# Patient Record
Sex: Male | Born: 1995 | Race: Black or African American | Hispanic: No | Marital: Single | Smoking: Never smoker
Health system: Southern US, Community
[De-identification: ages and names within clinical notes are randomized; demographics above are authoritative.]

## PROBLEM LIST (undated history)

## (undated) DIAGNOSIS — T7840XA Allergy, unspecified, initial encounter: Secondary | ICD-10-CM

## (undated) DIAGNOSIS — J45909 Unspecified asthma, uncomplicated: Secondary | ICD-10-CM

## (undated) HISTORY — DX: Allergy, unspecified, initial encounter: T78.40XA

---
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
## 2016-10-05 ENCOUNTER — Emergency Department (HOSPITAL_COMMUNITY)
Admission: EM | Admit: 2016-10-05 | Discharge: 2016-10-05 | Disposition: A | Discharge status: Home / Self Care | Payer: Self-pay | Attending: Emergency Medicine | Admitting: Emergency Medicine

## 2016-10-05 ENCOUNTER — Encounter (HOSPITAL_COMMUNITY): Payer: Self-pay | Admitting: Emergency Medicine

## 2016-10-05 DIAGNOSIS — J45909 Unspecified asthma, uncomplicated: Secondary | ICD-10-CM | POA: Insufficient documentation

## 2016-10-05 DIAGNOSIS — L299 Pruritus, unspecified: Secondary | ICD-10-CM | POA: Insufficient documentation

## 2016-10-05 HISTORY — DX: Unspecified asthma, uncomplicated: J45.909

## 2016-10-05 MED ORDER — FAMOTIDINE 20 MG PO TABS: 20.0000 mg | ORAL_TABLET | Freq: Two times a day (BID) | ORAL | 0 refills | Status: DC

## 2016-10-05 NOTE — ED Provider Notes (Signed)
[facility]-EMERGENCY DEPT Provider Note    By signing my name below, I, Earmon PhoenixJennifer Waddell, attest that this documentation has been prepared under the direction and in the presence of Melburn HakeNicole Nadeau, PA-C. Electronically Signed: Earmon PhoenixJennifer Waddell, ED Scribe. 10/05/16. 2:26 PM.   History   Chief Complaint Chief Complaint  Patient presents with  . Rash  . Hot Flashes   The history is provided by the patient and medical records. No language interpreter was used.    HPI Comments:  Gregory Mendez is a 20 y.o. male who presents to the Emergency Department complaining of an allergic reaction to something two weeks ago. Pt states he got hives on back and extremities and took Benadryl for treatment. He reports resolution of the hives but states once he gets hot and is active he feels a burning pain and itching where the hives were but denies any new rash/hives. He has not done anything to treat the symptoms. He denies any new medications, soaps, creams, lotions, detergents or any other personal hygiene products. No modifying factors are indicated. He denies HA, light headedness, dizziness, fever, chills, nausea, vomiting, abdominal pain, diarrhea, CP, SOB, difficulty swallowing, diaphoresis, numbness, tingling or weakness of any extremity or current rash. He reports allergy to Zyrtec. He does not have a PCP.   Past Medical History:  Diagnosis Date  . Asthma     There are no active problems to display for this patient.   History reviewed. No pertinent surgical history.     Home Medications    Prior to Admission medications   Medication Sig Start Date End Date Taking? Authorizing Provider  famotidine (PEPCID) 20 MG tablet Take 1 tablet (20 mg total) by mouth 2 (two) times daily. 10/05/16   Barrett HenleNicole Elizabeth Nadeau, PA-C    Family History History reviewed. No pertinent family history.  Social History Social History  Substance Use Topics  . Smoking status: Never Smoker  . Smokeless tobacco:  Never Used  . Alcohol use No     Allergies   Zyrtec [cetirizine]   Review of Systems Review of Systems  Constitutional: Negative for chills, diaphoresis and fever.  HENT: Negative for sore throat and trouble swallowing.   Eyes: Negative for visual disturbance.  Respiratory: Negative for shortness of breath.   Cardiovascular: Negative for chest pain.  Gastrointestinal: Negative for abdominal pain, diarrhea, nausea and vomiting.  Musculoskeletal: Negative for back pain and myalgias.  Skin: Positive for rash.  Allergic/Immunologic: Negative for immunocompromised state.  Neurological: Negative for dizziness, light-headedness and headaches.  Hematological: Does not bruise/bleed easily.     Physical Exam Updated Vital Signs BP 136/91 (BP Location: Right Arm)   Pulse 80   Temp 98.3 F (36.8 C) (Oral)   Resp 18   Ht 5\' 7"  (1.702 m)   Wt 135 lb (61.2 kg)   SpO2 100%   BMI 21.14 kg/m   Physical Exam  Constitutional: He is oriented to person, place, and time. He appears well-developed and well-nourished.  HENT:  Head: Normocephalic and atraumatic.  Mouth/Throat: Uvula is midline, oropharynx is clear and moist and mucous membranes are normal. No oropharyngeal exudate.  Eyes: Conjunctivae and EOM are normal. Right eye exhibits no discharge. Left eye exhibits no discharge. No scleral icterus.  Neck: Normal range of motion. Neck supple.  Cardiovascular: Normal rate, regular rhythm, normal heart sounds and intact distal pulses.   Pulmonary/Chest: Effort normal and breath sounds normal. No respiratory distress. He has no wheezes. He has no rales. He  exhibits no tenderness.  Abdominal: Soft. Bowel sounds are normal. He exhibits no distension and no mass. There is no tenderness. There is no rebound and no guarding.  Musculoskeletal: Normal range of motion. He exhibits no edema, tenderness or deformity.  Lymphadenopathy:    He has no cervical adenopathy.  Neurological: He is alert and  oriented to person, place, and time. No cranial nerve deficit or sensory deficit. He exhibits normal muscle tone. Coordination normal.  Normal strength and sensation.  Skin: Skin is warm and dry. Capillary refill takes less than 2 seconds. No rash noted.  Nursing note and vitals reviewed.    ED Treatments / Results  DIAGNOSTIC STUDIES: Oxygen Saturation is 100% on RA, normal by my interpretation.   COORDINATION OF CARE: 2:23 PM- Will prescribe Hydroxyzine and [facility]. Advise pt to take Tylenol/Ibuprofen for pain. Pt verbalizes understanding and agrees to plan.  Medications - No data to display  Labs (all labs ordered are listed, but only abnormal results are displayed) Labs Reviewed - No data to display  EKG  EKG Interpretation None       Radiology No results found.  Procedures Procedures (including critical care time)  Medications Ordered in ED Medications - No data to display   Initial Impression / Assessment and Plan / ED Course  I have reviewed the triage vital signs and the nursing notes.  Pertinent labs & imaging results that were available during my care of the patient were reviewed by me and considered in my medical decision making (see chart for details).  Clinical Course     Pt presents with itching/burning sensation to his arms, legs and back that occurs whenever he is active. Denies any known triggers or new irritants. Symptoms started after he had an episode of hives after drinking apple cider which she notes resolved completely after taking 3 doses of Benadryl at home. Denies any other associated symptoms. Denies any new rash. VSS. On initial valuation patient denied any pain or complaints at this time. Exam unremarkable, no rash noted. No neuro deficits. Bilateral upper and lower extremities are vascularly intact. Lungs clear to auscultation bilaterally. Moist mucous membranes. Patient is well, nontoxic appearing.  Plan to discharge patient home with pepcid as needed for itching and advised to take ibuprofen as needed for pain relief. Patient given information to follow up with PCP if symptoms continue. Discussed return precautions.  I personally performed the services described in this documentation, which was scribed in my presence. The recorded information has been reviewed and is accurate.   Final Clinical Impressions(s) / ED Diagnoses   Final diagnoses:  Itching    New Prescriptions Discharge Medication List as of 10/05/2016  2:31 PM    START taking these medications   Details  famotidine (PEPCID) 20 MG tablet Take 1 tablet (20 mg total) by mouth 2 (two) times daily., Starting Sat 10/05/2016, Print         Satira Sarkicole Elizabeth MontereyNadeau, [place] JerseyPA-C 10/05/16 1604    Margarita Grizzleanielle Ray, MD 10/14/16 Izell [id number]

## 2016-10-05 NOTE — ED Triage Notes (Signed)
Two weeks ago had hives, took benadryl and hives went away; since then daily hot flashes and all over sharp pains on back and legs where the hives once were. Itchy, burning feeling when the pain occurs.

## 2016-10-05 NOTE — Discharge Instructions (Signed)
Take your medication as prescribed this and for itching. You may also take Tylenol and/or ibuprofen as prescribed over-the-counter as needed for pain relief. Please follow up with a primary care provider from the Resource Guide provided below in one week if your symptoms have not improved. Please return to the Emergency Department if symptoms worsen or new onset of fever, chest pain, difficulty breathing, abdominal pain, vomiting, diarrhea, rash, numbness, tingling, weakness.

## 2016-10-05 NOTE — ED Notes (Signed)
Declined W/C at D/C and was escorted to lobby by RN. 

## 2018-04-22 ENCOUNTER — Encounter: Payer: Self-pay | Admitting: Physician Assistant

## 2018-04-22 ENCOUNTER — Ambulatory Visit (INDEPENDENT_AMBULATORY_CARE_PROVIDER_SITE_OTHER): Payer: BLUE CROSS/BLUE SHIELD | Admitting: Physician Assistant

## 2018-04-22 ENCOUNTER — Ambulatory Visit (INDEPENDENT_AMBULATORY_CARE_PROVIDER_SITE_OTHER): Payer: BLUE CROSS/BLUE SHIELD

## 2018-04-22 ENCOUNTER — Other Ambulatory Visit: Payer: Self-pay

## 2018-04-22 VITALS — BP 104/76 | HR 65 | Temp 98.0°F | Ht 66.5 in | Wt 148.4 lb

## 2018-04-22 DIAGNOSIS — M79674 Pain in right toe(s): Secondary | ICD-10-CM | POA: Diagnosis not present

## 2018-04-22 MED ORDER — NAPROXEN 500 MG PO TABS: 500.0000 mg | ORAL_TABLET | Freq: Two times a day (BID) | ORAL | 2 refills | Status: DC

## 2018-04-22 NOTE — Progress Notes (Signed)
    04/22/2018 11:20 AM   DOB: 11/25/1995 / MRN: [id number]  SUBJECTIVE:  Gregory Mendez is a 22 y.o. male presenting for right great toe pain that started after a "jamming" injury.  He sustained this during a basketball game.  Tells me he was able to ambulate after the injury.  He had significant pain and swelling in the days following however this seemed to improve.  He did get a fast med and was advised to come here for imaging.  He is tried NSAIDs and they do help some with the pain.  He is allergic to zyrtec [cetirizine].   He  has a past medical history of Allergy and Asthma.    He  reports that he has never smoked. He has never used smokeless tobacco. He reports that he does not drink alcohol or use drugs. He  has no sexual activity history on file. The patient  has no past surgical history on file.  His family history is not on file.  Review of Systems  Constitutional: Negative for chills and fever.  Respiratory: Negative for cough.   Cardiovascular: Negative for chest pain.  Genitourinary: Negative for dysuria.  Skin: Negative for itching and rash.  Neurological: Negative for dizziness.    The problem list and medications were reviewed and updated by myself where necessary and exist elsewhere in the encounter.   OBJECTIVE:  BP 104/76 (BP Location: Left Arm, Patient Position: Sitting, Cuff Size: Normal)   Pulse 65   Temp 98 F (36.7 C) (Oral)   Ht 5' 6.5" (1.689 m)   Wt 148 lb 6.4 oz (67.3 kg)   SpO2 100%   BMI 23.59 kg/m   Physical Exam  Constitutional: He is oriented to person, place, and time. He appears well-developed. He does not appear ill.  Eyes: Pupils are equal, round, and reactive to light. Conjunctivae and EOM are normal.  Cardiovascular: Normal rate.  Pulmonary/Chest: Effort normal.  Abdominal: He exhibits no distension.  Musculoskeletal: Normal range of motion.       Feet:  Neurological: He is alert and oriented to person, place, and time. No cranial  nerve deficit. Coordination normal.  Skin: Skin is warm and dry. He is not diaphoretic.  Psychiatric: He has a normal mood and affect.  Nursing note and vitals reviewed.   No results found for this or any previous visit (from the past 72 hour(s)).  Dg Foot Complete Right  Result Date: 04/22/2018 CLINICAL DATA:  Pain post blunt injury. EXAM: RIGHT FOOT COMPLETE - 3+ VIEW COMPARISON:  None. FINDINGS: There is no evidence of fracture or dislocation. There is no evidence of arthropathy or other focal bone abnormality. Soft tissues are unremarkable. IMPRESSION: Negative. Electronically Signed   By: Ted Mcalpineobrinka  Dimitrova M.D.   On: 04/22/2018 10:53    ASSESSMENT AND PLAN:  Sherilyn CooterHenry was seen today for right big toe.  Diagnoses and all orders for this visit:  Pain of right great toe: Rads clear.  NSAID and postop shoe.  He will call if he is not improving so I can get him in with podiatry. -     DG Foot Complete Right; Future    The patient is advised to call or return to clinic if he does not see an improvement in symptoms, or to seek the care of the closest emergency department if he worsens with the above plan.   Deliah BostonMichael Clark, MHS, PA-C Primary Care at [facility] 04/22/2018 11:20 AM

## 2018-04-22 NOTE — Patient Instructions (Addendum)
  Wear the postop shoe as much as possible.  Take the pain medication as needed.  Call me if you are not getting better with the plan so we can get you in with podiatry.   IF you received an x-ray today, you will receive an invoice from [facility]. Please contact [facility] at [phone] with questions or concerns regarding your invoice.   IF you received labwork today, you will receive an invoice from [facility]. Please contact [facility] at [phone] with questions or concerns regarding your invoice.   Our billing staff will not be able to assist you with questions regarding bills from these companies.  You will be contacted with the lab results as soon as they are available. The fastest way to get your results is to activate your My Chart account. Instructions are located on the last page of this paperwork. If you have not heard from us regarding the results in 2 weeks, please contact this office.

## 2018-09-03 ENCOUNTER — Ambulatory Visit (INDEPENDENT_AMBULATORY_CARE_PROVIDER_SITE_OTHER): Payer: BLUE CROSS/BLUE SHIELD | Admitting: Physician Assistant

## 2018-09-03 VITALS — BP 152/82 | HR 61 | Temp 98.0°F | Resp 16 | Ht 66.0 in | Wt 149.0 lb

## 2018-09-03 DIAGNOSIS — K4090 Unilateral inguinal hernia, without obstruction or gangrene, not specified as recurrent: Secondary | ICD-10-CM | POA: Diagnosis not present

## 2018-09-03 NOTE — Progress Notes (Signed)
   Gregory Mendez  MRN: [id number] DOB: [birth year]  PCP: Patient, No Pcp Per  Subjective:  Pt is a pleasant 22 year old male who presents to clinic for bulge of his lower right abdomen for the past few months. Sometimes the bulge will go back in, sometimes it doesn't.  Denies pain, warmth, tenderness to touch, nausea, vomiting, chills or fever.   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  He has never had this problem before.  No family history of hernias.    Review of Systems  Constitutional: Negative for chills and fever.  Gastrointestinal: Negative for abdominal pain, nausea and vomiting.  Musculoskeletal: Negative for back pain.    There are no active problems to display for this patient.   Current Outpatient Medications on File Prior to Visit  Medication Sig Dispense Refill  . naproxen (NAPROSYN) 500 MG tablet Take 1 tablet (500 mg total) by mouth 2 (two) times daily with a meal. As needed (Patient not taking: Reported on 09/03/2018) 30 tablet 2   No current facility-administered medications on file prior to visit.     Allergies  Allergen Reactions  . Zyrtec [Cetirizine]      Objective:  BP (!) 152/82   Pulse 61   Temp 98 F (36.7 C) (Oral)   Resp 16   Ht 5\' 6"  (1.676 m)   Wt 149 lb (67.6 kg)   SpO2 98%   BMI 24.05 kg/m   Physical Exam  Constitutional: He appears well-developed and well-nourished. No distress.  Abdominal: A hernia is present. Hernia confirmed positive in the right inguinal area (high suspicion ).  Genitourinary: Testes normal. Right testis shows no swelling. Left testis shows no swelling.  Genitourinary Comments: No reducible or non-reducible hernia appreciated. Possible tear in muscle wall.   Skin: Skin is warm and dry.  Psychiatric: He has a normal mood and affect. His behavior is normal. Judgment and thought content  normal.  Vitals reviewed.   Assessment and Plan :  1. Unilateral inguinal hernia without obstruction or gangrene, recurrence not specified - pt c/o bulge of his groin. HPI concerning for reducible inguinal hernia. No red flags. PE is unremarkable - checked for inguinal hernia, but not confident in my assessment. Will refer to surgery for evaluation.  - Ambulatory referral to General Surgery   Marco Collie, PA-C  Primary Care at [facility] 09/03/2018 3:10 PM  Please note: Portions of this report may have been transcribed using dragon voice recognition software. Every effort was made to ensure accuracy; however, inadvertent computerized transcription errors may be present.

## 2018-09-03 NOTE — Patient Instructions (Addendum)
   You will receive a phone call to schedule an appointment with surgery.   Inguinal Hernia, Adult An inguinal hernia is when fat or the intestines push through the area where the leg meets the lower belly (groin) and make a rounded lump (bulge). This condition happens over time. There are three types of inguinal hernias. These types include:  Hernias that can be pushed back into the belly (are reducible).  Hernias that cannot be pushed back into the belly (are incarcerated).  Hernias that cannot be pushed back into the belly and lose their blood supply (get strangulated). This type needs emergency surgery.  Follow these instructions at home: Lifestyle  Drink enough fluid to keep your urine (pee) clear or pale yellow.  Eat plenty of fruits, vegetables, and whole grains. These have a lot of fiber. Talk with your doctor if you have questions.  Avoid lifting heavy objects.  Avoid standing for long periods of time.  Do not use tobacco products. These include cigarettes, chewing tobacco, or e-cigarettes. If you need help quitting, ask your doctor.  Try to stay at a healthy weight. General instructions  Do not try to force the hernia back in.  Watch your hernia for any changes in color or size. Let your doctor know if there are any changes.  Take over-the-counter and prescription medicines only as told by your doctor.  Keep all follow-up visits as told by your doctor. This is important. Contact a doctor if:  You have a fever.  You have new symptoms.  Your symptoms get worse. Get help right away if:  The area where the legs meets the lower belly has: ? Pain that gets worse suddenly. ? A bulge that gets bigger suddenly and does not go down. ? A bulge that turns red or purple. ? A bulge that is painful to the touch.  You are a man and your scrotum: ? Suddenly feels painful. ? Suddenly changes in size.  You feel sick to your stomach (nauseous) and this feeling does not go  away.  You throw up (vomit) and this keeps happening.  You feel your heart beating a lot more quickly than normal.  You cannot poop (have a bowel movement) or pass gas. This information is not intended to replace advice given to you by your health care provider. Make sure you discuss any questions you have with your health care provider. Document Released: 12/05/2006 Document Revised: 04/11/2016 Document Reviewed: 09/14/2014 Elsevier Interactive Patient Education  2018 [employer].   IF you received an x-ray today, you will receive an invoice from [facility]. Please contact [facility] at [phone] with questions or concerns regarding your invoice.   IF you received labwork today, you will receive an invoice from [facility]. Please contact [facility] at [phone] with questions or concerns regarding your invoice.   Our billing staff will not be able to assist you with questions regarding bills from these companies.  You will be contacted with the lab results as soon as they are available. The fastest way to get your results is to activate your My Chart account. Instructions are located on the last page of this paperwork. If you have not heard from [place] regarding the results in 2 weeks, please contact this office.

## 2018-09-09 ENCOUNTER — Ambulatory Visit: Payer: Self-pay

## 2018-09-09 NOTE — Telephone Encounter (Signed)
FYI

## 2018-09-09 NOTE — Telephone Encounter (Signed)
Rec'd phone call from pt. with c/o severe right posterior hip pain.  Stated the pain started about 2 weeks ago, but has gotten much worse since yesterday.  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  Reported the pain is not as bad in the morning, and starts after he has been up for awhile; described as "constant sharp ache."  Rated at 10/10 at present time.  Denied any radiation of the pain; denied numbness or tingling in the right LE.  Stated standing for long periods, or climbing stairs aggravates the pain.  Denied any injury to the right hip.  Denied any localized redness, swelling, or warmth of the posterior right hip.  Denied any swelling of the right leg.  Noted that pt. was evaluated for right inguinal hernia  09/03/18. The pt. stated the hernia is not bulging or protruding at this time.  Reported he did not have the degree of right hip pain, at time of appt. on 10/17, so he did not mention it to the Provider.  Stated he took Tylenol, for the pain, last night, and was able to sleep.  Stated he took Aleve approx. 45 min. ago, and has not gotten any relief.  Due to no available appts. with PCP office this afternoon, advised he will need to go to UC. The pt. verb. understanding, and agreed with plan.         Reason for Disposition . [1] SEVERE pain (e.g., excruciating, unable to do any normal activities) AND [2] not improved after 2 hours of pain medicine  Answer Assessment - Initial Assessment Questions 1. LOCATION and RADIATION: "Where is the pain located?"      Right hip bone area, posterior  2. QUALITY: "What does the pain feel like?"  (e.g., sharp, dull, aching, burning)     "sharp ache"; nonradiating 3. SEVERITY: "How bad is the pain?" "What does it keep you from doing?"   (Scale 1-10; or mild, moderate, severe)   -  MILD (1-3): doesn't interfere with normal activities    -  MODERATE (4-7): interferes with normal activities (e.g., work or school) or awakens from sleep,  limping    -  SEVERE (8-10): excruciating pain, unable to do any normal activities, unable to walk    10/10 ; it starts after he has been up a little while 4. ONSET: "When did the pain start?" "Does it come and go, or is it there all the time?"     Started about 2 weeks ago; the pain got worse yesterday 5. WORK OR EXERCISE: "Has there been any recent work or exercise that involved this part of the body?"      Does not know about any specific cause  6. 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 AGGRAVATING FACTORS: "What makes the hip pain worse?" (e.g., walking, climbing stairs, running)     Climbing stairs, standing for long periods of time.  8. OTHER SYMPTOMS: "Do you have any other symptoms?" (e.g., back pain, pain shooting down leg,  fever, rash)     No visible swelling, redness, denied numbness or tingling;  Protocols used: HIP PAIN-A-AH

## 2018-11-26 IMAGING — DX DG FOOT COMPLETE 3+V*R*
3 series · 3 of 3 positions shown · non-contrast
Comparison: None.

CLINICAL DATA: Pain post blunt injury.

EXAM:
RIGHT FOOT COMPLETE - 3+ VIEW

[foot ap]
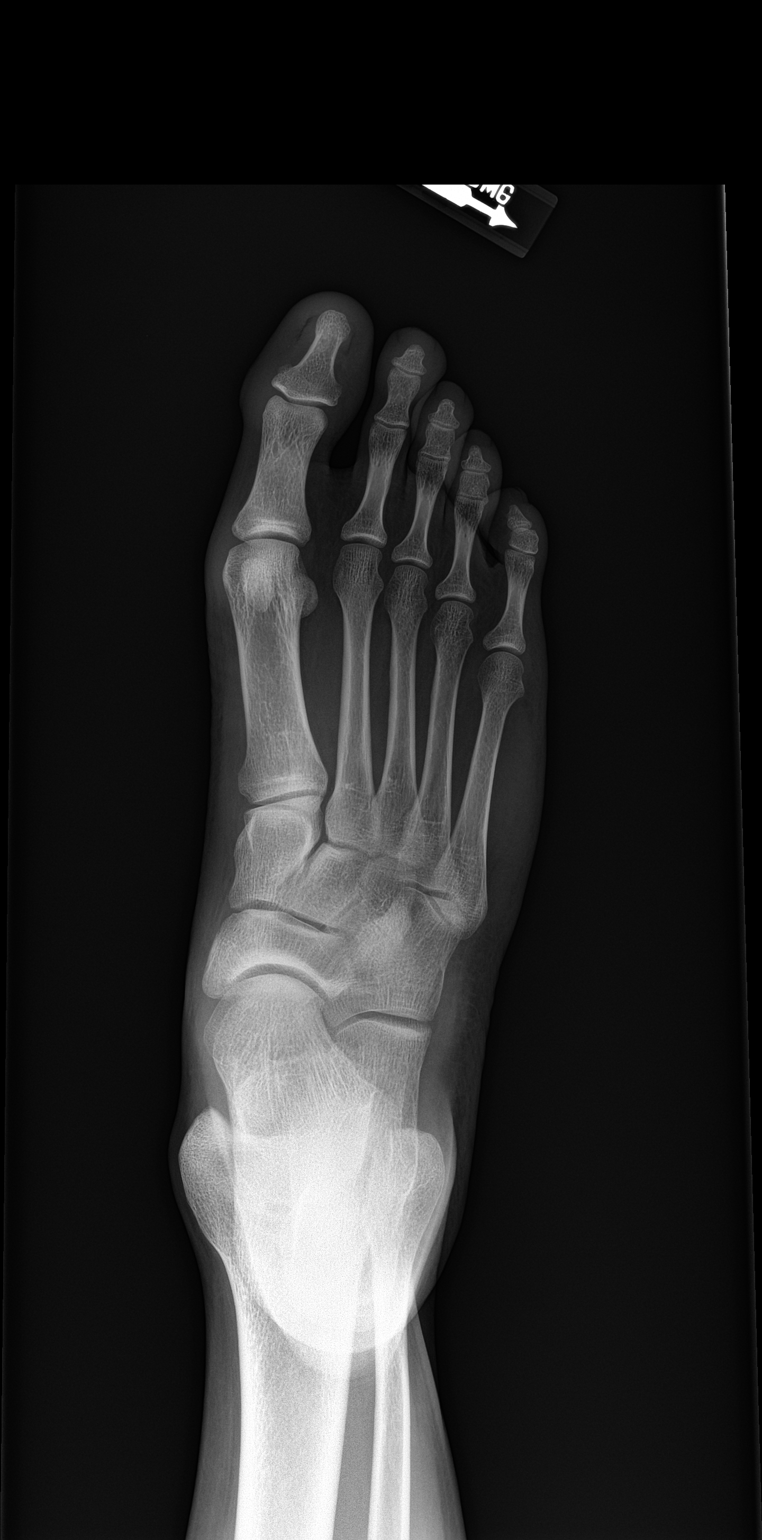

[foot obl]
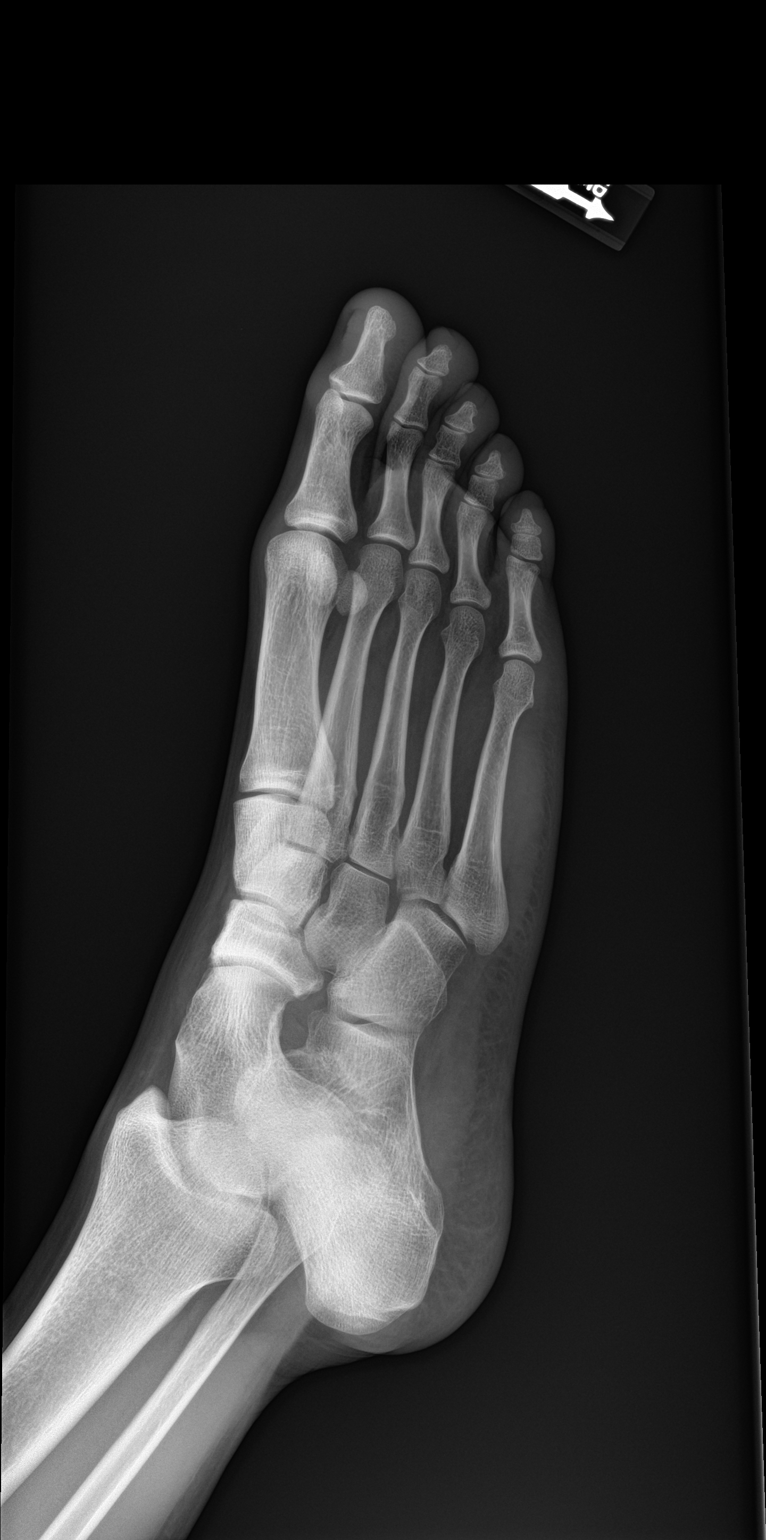

[foot lat]
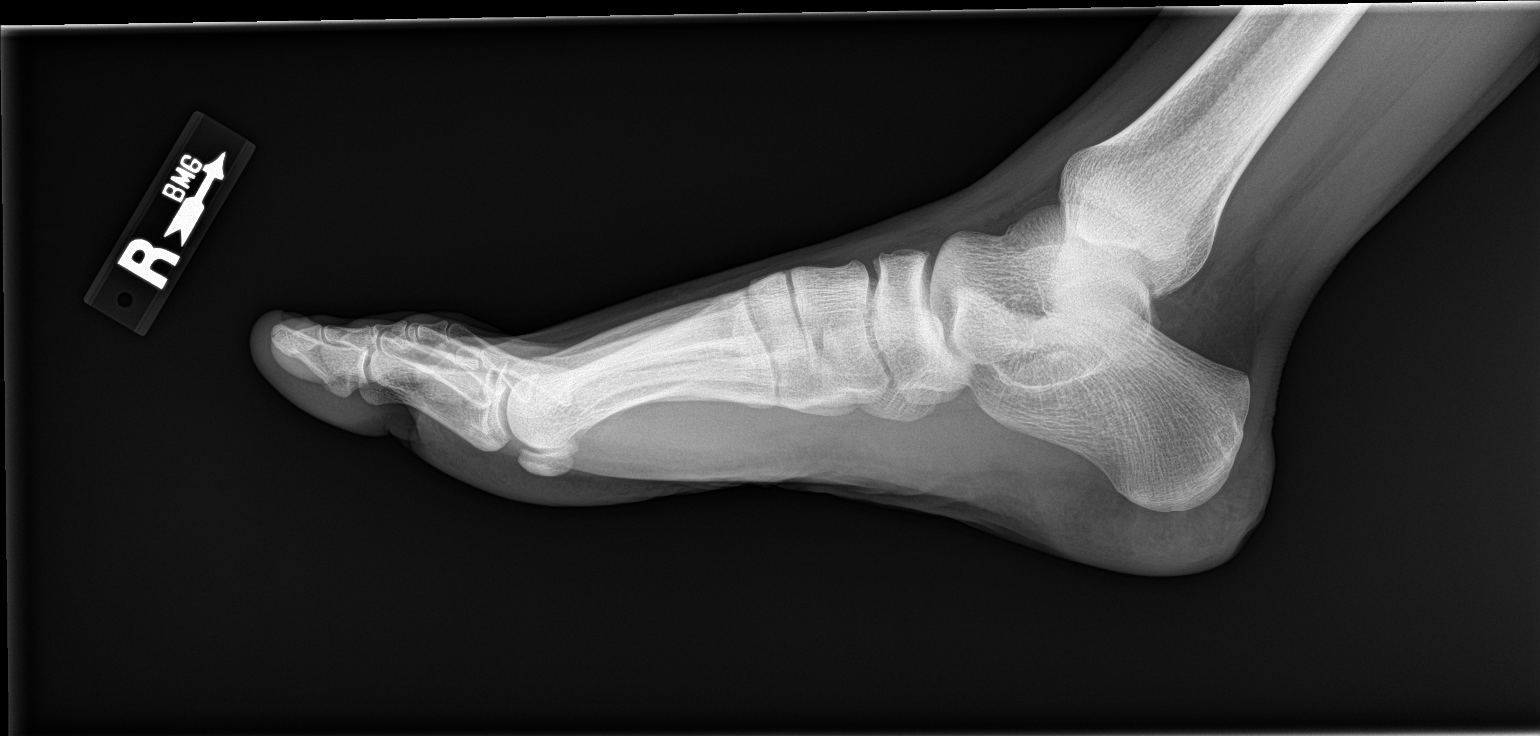

[3 of 3 positions shown; findings below may reference images not displayed]

FINDINGS: There is no evidence of fracture or dislocation. There is no
evidence of arthropathy or other focal bone abnormality. Soft
tissues are unremarkable.
IMPRESSION: Negative.

## 2018-11-28 ENCOUNTER — Ambulatory Visit (INDEPENDENT_AMBULATORY_CARE_PROVIDER_SITE_OTHER): Payer: BLUE CROSS/BLUE SHIELD | Admitting: Physician Assistant

## 2018-11-28 VITALS — BP 139/82 | HR 99 | Temp 98.0°F | Resp 16 | Ht 66.0 in | Wt 153.0 lb

## 2018-11-28 DIAGNOSIS — M25551 Pain in right hip: Secondary | ICD-10-CM

## 2018-11-28 DIAGNOSIS — K4090 Unilateral inguinal hernia, without obstruction or gangrene, not specified as recurrent: Secondary | ICD-10-CM | POA: Insufficient documentation

## 2018-11-28 NOTE — Progress Notes (Signed)
   JOAS SCIULLO  MRN: [id number] DOB: 24-Jun-1996  PCP: Patient, No Pcp Per  Subjective:  Pt is a 23 year old male who presents to clinic for right hip pain x 1 month.  He came to see me September 03, 2018 for right hip pain and was diagnosed with unilateral inguinal hernia.  Referred to surgery. Surgery is scheduled for Jan 23 at [facility].  Today he is requesting FMLA papers to be filled out for January 23 through February 17 for recovery.  He was recently seen in an urgent care for right hip pain.  X-rays were negative.  He was told that pain was most likely secondary to his hernia (per patient)  Review of Systems  Musculoskeletal: Positive for arthralgias (R hip). Negative for gait problem and joint swelling.  Skin: Negative.     There are no active problems to display for this patient.   Current Outpatient Medications on File Prior to Visit  Medication Sig Dispense Refill  . naproxen (NAPROSYN) 500 MG tablet Take 1 tablet (500 mg total) by mouth 2 (two) times daily with a meal. As needed (Patient not taking: Reported on 09/03/2018) 30 tablet 2   No current facility-administered medications on file prior to visit.     Allergies  Allergen Reactions  . Zyrtec [Cetirizine]      Objective:  BP 139/82   Pulse 99   Temp 98 F (36.7 C) (Oral)   Resp 16   Ht 5\' 6"  (1.676 m)   Wt 153 lb (69.4 kg)   SpO2 100%   BMI 24.69 kg/m   Physical Exam Vitals signs reviewed.  Constitutional:      Appearance: Normal appearance.  Musculoskeletal:     Right hip: He exhibits normal range of motion, normal strength, no tenderness, no bony tenderness and no crepitus.  Neurological:     Mental Status: He is alert.  Psychiatric:        Mood and Affect: Mood normal.        Behavior: Behavior normal.     Assessment and Plan :  1. Unilateral inguinal hernia without obstruction or gangrene, recurrence not specified 2. Right hip pain -Patient presents requesting FMLA paperwork  filled out for his inguinal hernia surgery which is scheduled for January 23.  Recovery will be approximately 3 weeks.  He has brought FMLA paperwork with him today, I will fill this out and fax it to his employer.  Marco Collie, PA-C  Primary Care at [facility] 11/28/2018 9:49 AM  Please note: Portions of this report may have been transcribed using dragon voice recognition software. Every effort was made to ensure accuracy; however, inadvertent computerized transcription errors may be present.

## 2018-11-28 NOTE — Patient Instructions (Addendum)
  Miss Gregory Mendez <3 - do we need to make a copy of [name] FMLA papers? If not, he's good to go.

## 2018-12-15 ENCOUNTER — Telehealth: Payer: Self-pay | Admitting: Physician Assistant

## 2018-12-15 NOTE — Telephone Encounter (Signed)
Patient needs forms completed for his employer by [employer] Col[employer] for his disability. I have completed the forms based off the OV notes and the last set of forms we completed. I will place the forms in [place] box on 12/15/18 please sign and return to the FMLA/Disability box at the check out desk within 5-7 business days. Thank you

## 2018-12-18 NOTE — Telephone Encounter (Signed)
Patient called to let the doctor know that his FMLA forms were faxed but they were not completely filled out.  Patient would like to know why and would like the completed forms refaxed.  Please advise and call patient back as soon as possible.  CB# [phone]

## 2018-12-18 NOTE — Telephone Encounter (Signed)
Please advise 

## 2018-12-21 NOTE — Telephone Encounter (Signed)
Forms revised and sent back to fmla staff

## 2018-12-22 NOTE — Telephone Encounter (Signed)
Paperwork scanned and faxed on 12/22/18 °

## 2021-02-12 ENCOUNTER — Ambulatory Visit (INDEPENDENT_AMBULATORY_CARE_PROVIDER_SITE_OTHER): Payer: BC Managed Care – PPO | Admitting: Registered Nurse

## 2021-02-12 ENCOUNTER — Encounter: Payer: Self-pay | Admitting: Registered Nurse

## 2021-02-12 ENCOUNTER — Other Ambulatory Visit: Payer: Self-pay

## 2021-02-12 VITALS — BP 112/82 | HR 79 | Temp 98.4°F | Resp 18 | Ht 66.0 in | Wt 159.4 lb

## 2021-02-12 DIAGNOSIS — M25532 Pain in left wrist: Secondary | ICD-10-CM | POA: Diagnosis not present

## 2021-02-12 DIAGNOSIS — M25531 Pain in right wrist: Secondary | ICD-10-CM | POA: Diagnosis not present

## 2021-02-12 MED ORDER — DICLOFENAC SODIUM 1 % EX GEL: 2.0000 g | Freq: Four times a day (QID) | CUTANEOUS | 0 refills | Status: AC

## 2021-02-12 MED ORDER — PREDNISONE 10 MG (21) PO TBPK: ORAL_TABLET | ORAL | 0 refills | Status: DC

## 2021-02-12 NOTE — Patient Instructions (Signed)
° ° ° °  If you have lab work done today you will be contacted with your lab results within the next 2 weeks.  If you have not heard from us then please contact us. The fastest way to get your results is to register for My Chart. ° ° °IF you received an x-ray today, you will receive an invoice from [facility]. Please contact [facility] at [phone] with questions or concerns regarding your invoice.  ° °IF you received labwork today, you will receive an invoice from [facility]. Please contact [facility] at [phone] with questions or concerns regarding your invoice.  ° °Our billing staff will not be able to assist you with questions regarding bills from these companies. ° °You will be contacted with the lab results as soon as they are available. The fastest way to get your results is to activate your My Chart account. Instructions are located on the last page of this paperwork. If you have not heard from us regarding the results in 2 weeks, please contact this office. °  ° ° ° °

## 2021-02-12 NOTE — Progress Notes (Signed)
New Patient Office Visit  Subjective:  Patient ID: Gregory Mendez, male    DOB: December 23, 1995  Age: 25 y.o. MRN: [id number]  CC:  Chief Complaint  Patient presents with  . Hand Pain    Patient is her for TOC also since jan he been having some hand pain and some numbness in both hands. Patient needs to discuss FMLA paperwork.    HPI Gregory Mendez presents for visit to est care.  Histories reviewed, updated as warranted  Bilateral hand pain: Ongoing, R>L but both affected, R hand dominant Aching pain through wrist that causes numbness in fingers, particularly 3,4,5th digits. Some weakness, limited ROM Works at [employer], repetitive motions daily No swelling or redness, no pain in elbow or shoulders, no neck pain No hx of injury Does note fam hx of arthritis in mother, unsure what kind, states that it is "all over".  FMLA Needs reissue of existing FMLA for hx of inguinal hernia Had in 2019 with repair with mesh Now has chair at work for intermittent sitting given his ongoing aches and pain from hernia repair No apparent recurrence of hernia, denies bowel changes, pain changes, or other concerns.  Otherwise feeling well.  Past Medical History:  Diagnosis Date  . Allergy   . Asthma     Past Surgical History:  Procedure Laterality Date  . HERNIA REPAIR     r inguinal    History reviewed. No pertinent family history.  Social History   Socioeconomic History  . Marital status: Single    Spouse name: Not on file  . Number of children: Not on file  . Years of education: Not on file  . Highest education level: Not on file  Occupational History  . Not on file  Tobacco Use  . Smoking status: Never Smoker  . Smokeless tobacco: Never Used  Vaping Use  . Vaping Use: Never used  Substance and Sexual Activity  . Alcohol use: Yes    Comment: weekends  . Drug use: No  . Sexual activity: Not on file  Other Topics Concern  . Not on file  Social History Narrative  . Not on  file   Social Determinants of Health   Financial Resource Strain: Not on file  Food Insecurity: Not on file  Transportation Needs: Not on file  Physical Activity: Not on file  Stress: Not on file  Social Connections: Not on file  Intimate Partner Violence: Not on file    ROS Review of Systems  Constitutional: Negative.   HENT: Negative.   Eyes: Negative.   Respiratory: Negative.   Cardiovascular: Negative.   Gastrointestinal: Negative.   Genitourinary: Negative.   Musculoskeletal: Negative.   Skin: Negative.   Neurological: Negative.   Psychiatric/Behavioral: Negative.   All other systems reviewed and are negative.   Objective:   Today's Vitals: BP 112/82   Pulse 79   Temp 98.4 F (36.9 C) (Temporal)   Resp 18   Ht 5\' 6"  (1.676 m)   Wt 159 lb 6.4 oz (72.3 kg)   SpO2 99%   BMI 25.73 kg/m   Physical Exam Vitals and nursing note reviewed.  Constitutional:      Appearance: Normal appearance.  Cardiovascular:     Rate and Rhythm: Normal rate and regular rhythm.     Pulses: Normal pulses.     Heart sounds: Normal heart sounds. No murmur heard. No friction rub. No gallop.   Pulmonary:     Effort: Pulmonary effort is  normal. No respiratory distress.     Breath sounds: Normal breath sounds. No stridor. No wheezing, rhonchi or rales.  Musculoskeletal:        General: Tenderness present. No swelling, deformity or signs of injury.     Right lower leg: No edema.     Left lower leg: No edema.     Comments: ROM in both wrist limited by pain. Flexion more limited than extension. No apparent swelling or redness. Positive Tinel bilaterally.  Neurological:     General: No focal deficit present.     Mental Status: He is alert. Mental status is at baseline.  Psychiatric:        Mood and Affect: Mood normal.        Behavior: Behavior normal.        Thought Content: Thought content normal.        Judgment: Judgment normal.     Assessment & Plan:   Problem List Items  Addressed This Visit   None   Visit Diagnoses    Bilateral wrist pain    -  Primary   Relevant Medications   predniSONE (STERAPRED UNI-PAK 21 TAB) 10 MG (21) TBPK tablet   diclofenac Sodium (VOLTAREN) 1 % GEL   Other Relevant Orders   Ambulatory referral to Hand Surgery      Outpatient Encounter Medications as of 02/12/2021  Medication Sig  . diclofenac Sodium (VOLTAREN) 1 % GEL Apply 2 g topically 4 (four) times daily.  . predniSONE (STERAPRED UNI-PAK 21 TAB) 10 MG (21) TBPK tablet Take per package instructions. Do not skip doses. Finish entire supply.  . naproxen (NAPROSYN) 500 MG tablet Take 1 tablet (500 mg total) by mouth 2 (two) times daily with a meal. As needed (Patient not taking: No sig reported)   No facility-administered encounter medications on file as of 02/12/2021.    Follow-up: No follow-ups on file.   PLAN  Prednisone taper and topical diclofenac for pain. Suggest RICE. Out of work x 2-3 days for rest. Suggest avoiding excessive phone use, typing, video games, or other activities that engage hands and wrist excessively.   Refer to hand specialist  Will sign off on FMLA paperwork and fax to [employer] pt return for CPE and labs in 2022.  Patient encouraged to call clinic with any questions, comments, or concerns.  Janeece Agee, NP

## 2021-02-20 ENCOUNTER — Ambulatory Visit (INDEPENDENT_AMBULATORY_CARE_PROVIDER_SITE_OTHER): Payer: BC Managed Care – PPO | Admitting: Family Medicine

## 2021-02-20 ENCOUNTER — Other Ambulatory Visit: Payer: Self-pay

## 2021-02-20 ENCOUNTER — Encounter: Payer: Self-pay | Admitting: Family Medicine

## 2021-02-20 DIAGNOSIS — M25531 Pain in right wrist: Secondary | ICD-10-CM | POA: Diagnosis not present

## 2021-02-20 DIAGNOSIS — M25532 Pain in left wrist: Secondary | ICD-10-CM | POA: Diagnosis not present

## 2021-02-20 NOTE — Progress Notes (Signed)
I saw and examined the patient with Dr. Marga Hoots and agree with assessment and plan as outlined.    Works at [employer] for the past 5 years.  Pain for about a year.  Changed positions at work and it helped, but now back on the position that seems to make pain worse.  Recently started prednisone and feels almost completely better.  Exam unrevealing.  Question CTS or Guyon's canal entrapment.    If symptoms recur, try sleeping in wrist braces at night.  If symptoms persist, come back for exam and x-rays.

## 2021-02-20 NOTE — Progress Notes (Signed)
Office Visit Note   Patient: Gregory Mendez           Date of Birth: 03-19-1996           MRN: [id number] Visit Date: 02/20/2021 Requested by: Janeece Agee, NP [address] [place],  [place] [address] PCP: Janeece Agee, NP  Subjective: Chief Complaint  Patient presents with  . Right Wrist - Pain    Pain in both wrists since January of this year, right greater than left. Right-hand dominant. NKI. Has had tingling and numbness but has gotten better since last week, after being given prednisone.  . Left Wrist - Pain    HPI: 25yo RHD M presenting to clinic with concerns of R>>>L wrist pain. 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 He says pain started last year (December), after he was moved to a more physical location at his job. He sought help from his primary care last week, who gave him a course of prednisone. Since taking the prednisone, he says he feels 'much' better. States pain is 80-90% improved, and he's worried he shouldn't have started taking this medication until after his evaluation here, as his symptoms as so much better now. He has difficulty pinpointing an exact location of discomfort within his wrist, saying that it hurt throughout the dorsal aspect and around the thumb. No trauma. Does say that his 4th and 5th fingers will occasionally get numb- which is most noticeable in the mornings. He will also get cramping in his triceps when he stretches his wrist. He was previously given a wrist brace for his discomfort, but no longer wears this. He otherwise did not try any Advil, Motrin, Tylenol for his pain. Endorses a family history of osteoarthritis, though denies any known history of rheumatic disease.               ROS:   All other systems were reviewed and are negative.  Objective: Vital Signs: There were no vitals taken for this visit.  Physical Exam:  General:  Alert and oriented, in no acute  distress. Pulm:  Breathing unlabored. Psy:  Normal mood, congruent affect. Skin:  Bilateral hands with no bruising, rashes, or erythema. Overlying skin intact.   Wrist Exam:  No obvious swelling, deformity or discoloration.  ROM: Bilateral hands with full Active ROM in wrist and all fingers without pain. Normal MCP/PIP/DIP joint flexion and extension of all fingers. No finger rotational abnormality.   Strength: No pain with resisted extension or flexion of the wrist.  Grip strength mildly reduced on the right when compared to the left, Normal thumb abduction and opposition.  Normal finger abduction and adduction, and normal flexor superficialis (PIP) and Profundus strength (DIP).   Palpation: No Snuff Box tenderness. No pain with compression over the lunate and triquetrum. No pain with palpation in TFCC area, or over scaphoid tubercle.  No pain over distal radius or ulna.   DRUJ/TFCC Testing:  No pain or clicking with ulnar grind. Piano Key: No [ain or relative increased movement of the distal ulna compared to the to opposite wrist with volar depression of the distal Ulna. Supination lift test: Does cause pain  Others:  Carpal tunnel: Phalen's negative. Tinel's Negative. Cubital Tunnel: Tinnel negative in the cubital and guyon tunnels DeQuervain's Tenosynovitis- Finklestein's test negative  Epicondylopathies:  No pain with palpation over medial or lateral elbow epicondyles. No pain with resisted wrist flexion/extension, or middle  finger extension.   Sensation intact throughout all fingers Brisk capillary refill.    Imaging: No results found.  Assessment & Plan: 25yo M presenting to clinic with concerns of right wrist pain, which he says is 80-90% improved since starting prednisone. Suspect an overuse injury, though given lack of symptoms today, it is hard to pinpoint the exact source of the irritation.  - If symptoms return, he is to do a trial of wearing his previously  prescribed wrist braces (esp at night).  - If no benefit with bracing, RTC for reevaluation while at the height of his symptoms to allow for better diagnostic evaluation. - Patient expressed understanding with plan. He has no further questions or concerns today.      Procedures: No procedures performed        PMFS History: Patient Active Problem List   Diagnosis Date Noted  . Unilateral inguinal hernia without obstruction or gangrene 11/28/2018   Past Medical History:  Diagnosis Date  . Allergy   . Asthma     History reviewed. No pertinent family history.  Past Surgical History:  Procedure Laterality Date  . HERNIA REPAIR     r inguinal   Social History   Occupational History  . Not on file  Tobacco Use  . Smoking status: Never Smoker  . Smokeless tobacco: Never Used  Vaping Use  . Vaping Use: Never used  Substance and Sexual Activity  . Alcohol use: Yes    Comment: weekends  . Drug use: No  . Sexual activity: Not on file

## 2021-04-25 ENCOUNTER — Ambulatory Visit (INDEPENDENT_AMBULATORY_CARE_PROVIDER_SITE_OTHER): Payer: BC Managed Care – PPO | Admitting: Family Medicine

## 2021-04-25 ENCOUNTER — Ambulatory Visit: Payer: Self-pay

## 2021-04-25 ENCOUNTER — Other Ambulatory Visit: Payer: Self-pay

## 2021-04-25 DIAGNOSIS — M25532 Pain in left wrist: Secondary | ICD-10-CM | POA: Diagnosis not present

## 2021-04-25 DIAGNOSIS — M25531 Pain in right wrist: Secondary | ICD-10-CM | POA: Diagnosis not present

## 2021-04-25 NOTE — Progress Notes (Signed)
Office Visit Note   Patient: Gregory Mendez           Date of Birth: [birth year]           MRN: [id number] Visit Date: 04/25/2021 Requested by: Janeece Agee, NP [address] [place],  [place] [address] PCP: Janeece Agee, NP  Subjective: Chief Complaint  Patient presents with  . Right Wrist - Pain, Follow-up    Pain flared up this past Sunday and Monday, after cooking on Sunday. He took 2 ibuprofen and wore his wrist splint that night. The pain is better today, but the wrist is still stiff. Cannot do pushups nor lift weights.  . Left Wrist - Pain, Follow-up    The left wrist is feeling better, but his thumb was hurting after massaging the right wrist with the left hand. That pain is better.    HPI: 25yo M presenting to clinic with recurrent bilateral wrist pain. He is still working at a physically demanding job, but will be starting with a new employer soon and is hoping this won't be so taxing. Continues to endorse significant pain in both wrists (R>>L), accompanied by a popping sensation. Pain is throughout the dorsum of his wrists, and is worsened with weight lifting, and any weight bearing through his hands (push ups, etc). States he has tried various braces that were given to him by a 'Scientist, water quality' at work, which did not offer any improvement (and he states some made him feel worse). No weakness/tingling in the hands. No new trauma.               ROS:   All other systems were reviewed and are negative.  Objective: Vital Signs: There were no vitals taken for this visit.  Physical Exam:  General:  Alert and oriented, in no acute distress. Pulm:  Breathing unlabored. Psy:  Normal mood, congruent affect. Skin:  Bilateral wrists with no bruising, rashes, or erythema.   Bilateral Wrist Exam:  No obvious swelling, deformity or discoloration.  ROM: Full Active ROM in wrist and all fingers- endorses come discomfort with extremes of flexion.  Normal MCP/PIP/DIP joint flexion  and extension of all fingers. No finger rotational abnormality.   Strength: No pain with resisted extension or flexion of the wrist.  Normal Grip strength, Normal thumb abduction and opposition.  Normal finger abduction and adduction, and normal flexor superficialis (PIP) and Profundus strength (DIP).   Palpation: Endorses tenderness with palpation over lunate prominence. No Snuff Box tenderness. No pain with palpation in TFCC area, or over scaphoid tubercle.  No pain over distal radius or ulna.   Carpal Special Tests:  Watson's Test: Subtle click appreciated within carpal area with ulnar/radial deviation Reagan Test (Luno-triquetral ballotment test): Endorses pain, and has subtle clicking appreciated.  Schuck Test: Endorses pain, and has bilateral carpal shift appreciated.   DRUJ/TFCC Testing:  No pain or clicking with ulnar grind. Piano Key: No [ain or relative increased movement of the distal ulna compared to the to opposite wrist with volar depression of the distal Ulna. Supination lift test: Negative  Others:  Carpal tunnel: Phalen's negative. Tinel's Negative. DeQuervain's Tenosynovitis- Finklestein's test negative  Sensation intact throughout all fingers Brisk capillary refill.    Imaging: Bilateral wrist films with no obvious increase in spacing within carpal bones on pencil grip view. No acute bony abnormalities.   Assessment & Plan: 25yo M presenting to clinic with recurrent bilateral wrist pain. He has tried multiple braces, but  no carpal tunnel splints. Will try appropriate bracing today. He is also expecting to shift jobs to something less physically taxing, which may further improve his symptoms. - If no improvement with appropriate bracing, relative rest, would order MRI - Patient expresses understanding with plan, he had no further questions or concerns today.      Procedures: No procedures performed        PMFS History: Patient Active Problem List    Diagnosis Date Noted  . Unilateral inguinal hernia without obstruction or gangrene 11/28/2018   Past Medical History:  Diagnosis Date  . Allergy   . Asthma     No family history on file.  Past Surgical History:  Procedure Laterality Date  . HERNIA REPAIR     r inguinal   Social History   Occupational History  . Not on file  Tobacco Use  . Smoking status: Never Smoker  . Smokeless tobacco: Never Used  Vaping Use  . Vaping Use: Never used  Substance and Sexual Activity  . Alcohol use: Yes    Comment: weekends  . Drug use: No  . Sexual activity: Not on file

## 2021-04-25 NOTE — Progress Notes (Signed)
I saw and examined the patient with Dr. Marga Hoots and agree with assessment and plan as outlined.    Right greater than left wrist pain.  Question scapholunate ligament tear.    Will try carpal tunnel braces.  MRI arthrogram if symptoms don't improve.

## 2022-03-07 ENCOUNTER — Ambulatory Visit: Payer: BC Managed Care – PPO | Admitting: Registered Nurse

## 2022-03-08 ENCOUNTER — Ambulatory Visit: Payer: 59 | Admitting: Registered Nurse

## 2022-03-08 ENCOUNTER — Encounter: Payer: Self-pay | Admitting: Registered Nurse

## 2022-03-08 VITALS — BP 128/76 | HR 78 | Temp 98.1°F | Resp 18 | Ht 66.0 in | Wt 159.4 lb

## 2022-03-08 DIAGNOSIS — S161XXA Strain of muscle, fascia and tendon at neck level, initial encounter: Secondary | ICD-10-CM | POA: Diagnosis not present

## 2022-03-08 MED ORDER — CYCLOBENZAPRINE HCL 5 MG PO TABS: 5.0000 mg | ORAL_TABLET | Freq: Three times a day (TID) | ORAL | 1 refills | Status: AC | PRN

## 2022-03-08 NOTE — Patient Instructions (Addendum)
Gregory Mendez - ? ?Great to see you. ? ?Stretch neck, chest, and upper back 10-15 minutes 3-4 times a day. ?Use flexeril and ibuprofen as needed. ? ?Finish course of sucralfate and take one more week or protonix. ? ?Call if not better or any worse within 2 weeks ? ?Thanks, ? ?Rich  ? ? ? ?If you have lab work done today you will be contacted with your lab results within the next 2 weeks.  If you have not heard from [place] then please contact us. The fastest way to get your results is to register for My Chart. ? ? ?IF you received an x-ray today, you will receive an invoice from [facility]. Please contact [facility] at [phone] with questions or concerns regarding your invoice.  ? ?IF you received labwork today, you will receive an invoice from [facility]. Please contact [facility] at [phone] with questions or concerns regarding your invoice.  ? ?Our billing staff will not be able to assist you with questions regarding bills from these companies. ? ?You will be contacted with the lab results as soon as they are available. The fastest way to get your results is to activate your My Chart account. Instructions are located on the last page of this paperwork. If you have not heard from [place] regarding the results in 2 weeks, please contact this office. ?  ? ? ?

## 2022-03-08 NOTE — Progress Notes (Signed)
? ?Established Patient Office Visit ? ?Subjective:  ?Patient ID: Gregory Mendez, male    DOB: 02-28-1996  Age: 26 y.o. MRN: [id number] ? ?CC:  ?Chief Complaint  ?Patient presents with  ? Hospitalization Follow-up  ?  Patient states he was in the hospital for chest pain, left arm numbnes.  ? ? ?HPI ?[place] Mon presents for ER follow up ? ?Was seen on 02/28/22  ?Presented with acute L side chest pain. Worsened through evening, woke him out of sleep around 1 am prompting visit.  ?Unremarkable EKG, no PE on imaging, no cardiomegaly. EF 61%. ?Labs notable for A1c at 4.3, otherwise unremarkable. UDS positive for opioids, was given IM opioid in hospital for pain.  ? ?It was felt symptoms most likely GERD or msk strain. D/c on sucralfate, protonix ? ?Notes he has been feeling a lot better. ?Still on and off pain and numbness in L arm. Taking ibuprofen as needed with good effect.  ?No weakness. No headaches. No claudication or dependent edema.  ? ?Outpatient Medications Prior to Visit  ?Medication Sig Dispense Refill  ? ibuprofen (ADVIL) 200 MG tablet Take 200 mg by mouth every 8 (eight) hours as needed.    ? diclofenac Sodium (VOLTAREN) 1 % GEL Apply 2 g topically 4 (four) times daily. 150 g 0  ? ?No facility-administered medications prior to visit.  ? ? ?Review of Systems  ?Constitutional: Negative.   ?HENT: Negative.    ?Eyes: Negative.   ?Respiratory: Negative.    ?Cardiovascular: Negative.   ?Gastrointestinal: Negative.   ?Genitourinary: Negative.   ?Musculoskeletal: Negative.   ?Skin: Negative.   ?Neurological: Negative.   ?Psychiatric/Behavioral: Negative.    ?All other systems reviewed and are negative. ? ?  ?Objective:  ?  ? ?BP 128/76   Pulse 78   Temp 98.1 ?F (36.7 ?C) (Temporal)   Resp 18   Ht 5\' 6"  (1.676 m)   Wt 159 lb 6.4 oz (72.3 kg)   SpO2 97%   BMI 25.73 kg/m?  ? ?Wt Readings from Last 3 Encounters:  ?03/08/22 159 lb 6.4 oz (72.3 kg)  ?02/12/21 159 lb 6.4 oz (72.3 kg)  ?11/28/18 153 lb (69.4 kg)   ? ?Physical Exam ?Constitutional:   ?   General: He is not in acute distress. ?   Appearance: Normal appearance. He is normal weight. He is not ill-appearing, toxic-appearing or diaphoretic.  ?Cardiovascular:  ?   Rate and Rhythm: Normal rate and regular rhythm.  ?   Heart sounds: Normal heart sounds. No murmur heard. ?  No friction rub. No gallop.  ?Pulmonary:  ?   Effort: Pulmonary effort is normal. No respiratory distress.  ?   Breath sounds: Normal breath sounds. No stridor. No wheezing, rhonchi or rales.  ?Chest:  ?   Chest wall: No tenderness.  ?Musculoskeletal:     ?   General: Tenderness (mild along cervical portion of L trapezius muscle) present. No swelling, deformity or signs of injury. Normal range of motion.  ?   Right lower leg: No edema.  ?   Left lower leg: No edema.  ?Neurological:  ?   General: No focal deficit present.  ?   Mental Status: He is alert and oriented to person, place, and time. Mental status is at baseline.  ?Psychiatric:     ?   Mood and Affect: Mood normal.     ?   Behavior: Behavior normal.     ?   Thought Content: Thought content  normal.     ?   Judgment: Judgment normal.  ? ? ?No results found for any visits on 03/08/22. ? ? ? ?The ASCVD Risk score (Arnett DK, et al., 2019) failed to calculate for the following reasons: ?  The 2019 ASCVD risk score is only valid for ages 38 to 71 ? ?  ?Assessment & Plan:  ? ?Problem List Items Addressed This Visit   ?None ?Visit Diagnoses   ? ? Strain of cervical portion of left trapezius muscle    -  Primary  ? Relevant Medications  ? cyclobenzaprine (FLEXERIL) 5 MG tablet  ? ?  ? ? ?Meds ordered this encounter  ?Medications  ? cyclobenzaprine (FLEXERIL) 5 MG tablet  ?  Sig: Take 1 tablet (5 mg total) by mouth 3 (three) times daily as needed for muscle spasms.  ?  Dispense:  30 tablet  ?  Refill:  1  ?  Order Specific Question:   Supervising Provider  ?  Answer:   Neva Seat, JEFFREY R [[id number]]  ? ? ?Return if symptoms worsen or fail to improve.   ? ?PLAN ?Suspect trapezius strain/spasm affecting arm and chest symptoms ?Continue ibuprofen, add flexeril tid prn. Reviewed risks, benefits, and side effects, pt voices understanding. ?Finish sucralfate and one more week of protonix. ?Monitor symptoms, call if worsening or failing to improve ?Patient encouraged to call clinic with any questions, comments, or concerns. ? ? ?Janeece Agee, NP ?
